# Patient Record
Sex: Female | Born: 1995 | Race: Black or African American | Hispanic: No | Marital: Single | Smoking: Never smoker
Health system: Southern US, Community
[De-identification: ages and names within clinical notes are randomized; demographics above are authoritative.]

---
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
## 2017-08-18 ENCOUNTER — Other Ambulatory Visit: Payer: Self-pay

## 2017-08-18 ENCOUNTER — Encounter (HOSPITAL_COMMUNITY): Payer: Self-pay | Admitting: Emergency Medicine

## 2017-08-18 ENCOUNTER — Emergency Department (HOSPITAL_COMMUNITY)
Admission: EM | Admit: 2017-08-18 | Discharge: 2017-08-18 | Disposition: A | Discharge status: Home / Self Care | Payer: Self-pay | Attending: Emergency Medicine | Admitting: Emergency Medicine

## 2017-08-18 DIAGNOSIS — N938 Other specified abnormal uterine and vaginal bleeding: Secondary | ICD-10-CM | POA: Insufficient documentation

## 2017-08-18 DIAGNOSIS — Z79899 Other long term (current) drug therapy: Secondary | ICD-10-CM | POA: Insufficient documentation

## 2017-08-18 LAB — I-STAT BETA HCG BLOOD, ED (MC, WL, AP ONLY)

## 2017-08-18 NOTE — ED Provider Notes (Signed)
[facility] EMERGENCY DEPARTMENT Provider Note   CSN: [id number] Arrival date & time: 08/18/17  1607   History   Chief Complaint Chief Complaint  Patient presents with  . Vaginal Bleeding    HPI Cassandra Velazquez is a 22 y.o. female.  HPI   22 year old female presents today with complaints of vaginal bleeding.  Patient notes a long-term history of irregular menstrual cycles.  She notes over the last several months she has had intermittent spotting.  She notes she started having bleeding yesterday typical of her menstrual cycles, she notes this was increased in volume noting she is used 7 pads over the last 24 hours.  Patient denies any dizziness, lightheadedness, chest pain or shortness of breath.  She denies any bleeding disorders.  She notes she has been under significant stress recently.  Patient reports she is not sexually active and has not been for over a year.  She denies any preceding vaginal discharge.  Very minimal pelvic cramping no significant abdominal pain.   History reviewed. No pertinent past medical history.  Patient Active Problem List   Diagnosis Date Noted  . Migraine without aura and without status migrainosus, not intractable 09/11/2014  . Episodic tension type headache 09/11/2014  . Poor sleep hygiene 09/11/2014  . Family history of migraine 09/11/2014    History reviewed. No pertinent surgical history.   OB History   None      Home Medications    Prior to Admission medications   Medication Sig Start Date End Date Taking? Authorizing Provider  loratadine (CLARITIN) 10 MG tablet Take 10 mg by mouth daily as needed for allergies.    [provider]    Family History History reviewed. No pertinent family history.  Social History Social History   Tobacco Use  . Smoking status: Never Smoker  . Smokeless tobacco: Never Used  Substance Use Topics  . Alcohol use: Not Currently    Alcohol/week: 0.0 oz  . Drug use:  Never     Allergies   Patient has no known allergies.   Review of Systems Review of Systems  All other systems reviewed and are negative.    Physical Exam Updated Vital Signs BP 109/67   Pulse 79   Temp 98.4 F (36.9 C) (Oral)   Resp 16   Ht 5\' 5"  (1.651 m)   Wt 64.4 kg (142 lb)   LMP 08/18/2017   SpO2 100%   BMI 23.63 kg/m   Physical Exam  Constitutional: She is oriented to person, place, and time. She appears well-developed and well-nourished.  HENT:  Head: Normocephalic and atraumatic.  Eyes: Pupils are equal, round, and reactive to light. Conjunctivae are normal. Right eye exhibits no discharge. Left eye exhibits no discharge. No scleral icterus.  Neck: Normal range of motion. No JVD present. No tracheal deviation present.  Pulmonary/Chest: Effort normal. No stridor.  Abdominal: Soft. She exhibits no distension and no mass. There is no tenderness. There is no rebound and no guarding. No hernia.  Neurological: She is alert and oriented to person, place, and time. Coordination normal.  Psychiatric: She has a normal mood and affect. Her behavior is normal. Judgment and thought content normal.  Nursing note and vitals reviewed.    ED Treatments / Results  Labs (all labs ordered are listed, but only abnormal results are displayed) Labs Reviewed  I-STAT BETA HCG BLOOD, ED ([facility], [facility], [facility] ONLY)    EKG None  Radiology No results found.  Procedures  Procedures (including critical care time)  Medications Ordered in ED Medications - No data to display   Initial Impression / Assessment and Plan / ED Course  I have reviewed the triage vital signs and the nursing notes.  Pertinent labs & imaging results that were available during my care of the patient were reviewed by me and considered in my medical decision making (see chart for details).      Final Clinical Impressions(s) / ED Diagnoses   Final diagnoses:  Dysfunctional uterine bleeding    Labs: I- stat  beta hcg  Imaging:  Consults:  Therapeutics:  Discharge Meds:   Assessment/Plan: 22 year old female presents today with vaginal bleeding.  This is day 2, likely her menstrual cycle.  She has no significant pain, no hemodynamic changes.  Likely dysfunctional uterine bleeding.  Patient's mother is at bedside, patient will be discharged with outpatient OB/GYN follow-up, if she continues to have heavy bleeding, increased bleeding, or any concerning signs or symptoms she will return.  Both mother and daughter verbalized understanding and agreement to today's plan had no further questions or concerns.    ED Discharge Orders    None       Rosalio LoudHedges, [place], PA-C 08/18/17 2114    Tilden Fossaees, Elizabeth, MD 08/19/17 1331

## 2017-08-18 NOTE — Discharge Instructions (Signed)
Please read attached information. If you experience any new or worsening signs or symptoms please return to the emergency room for evaluation. Please follow-up with your primary care provider or specialist as discussed. Please use medication prescribed only as directed and discontinue taking if you have any concerning signs or symptoms.   °

## 2017-08-18 NOTE — ED Triage Notes (Signed)
Pt states she has not had a period since January, but has had random spotting. This morning, pt woke up in blood with lots of clots. Pt has used 4 large tampons today. Pt also reports abd cramping.

## 2017-12-20 ENCOUNTER — Encounter (HOSPITAL_COMMUNITY): Payer: Self-pay | Admitting: Emergency Medicine

## 2017-12-20 ENCOUNTER — Emergency Department (HOSPITAL_COMMUNITY)
Admission: EM | Admit: 2017-12-20 | Discharge: 2017-12-20 | Disposition: A | Discharge status: Home / Self Care | Payer: No Typology Code available for payment source | Attending: Emergency Medicine | Admitting: Emergency Medicine

## 2017-12-20 DIAGNOSIS — Y92410 Unspecified street and highway as the place of occurrence of the external cause: Secondary | ICD-10-CM | POA: Diagnosis not present

## 2017-12-20 DIAGNOSIS — G44319 Acute post-traumatic headache, not intractable: Secondary | ICD-10-CM | POA: Diagnosis not present

## 2017-12-20 DIAGNOSIS — Y999 Unspecified external cause status: Secondary | ICD-10-CM | POA: Diagnosis not present

## 2017-12-20 DIAGNOSIS — M25562 Pain in left knee: Secondary | ICD-10-CM | POA: Diagnosis present

## 2017-12-20 DIAGNOSIS — Y939 Activity, unspecified: Secondary | ICD-10-CM | POA: Insufficient documentation

## 2017-12-20 MED ORDER — NAPROXEN 500 MG PO TABS: 500.0000 mg | ORAL_TABLET | Freq: Two times a day (BID) | ORAL | 0 refills | Status: DC

## 2017-12-20 MED ORDER — METHOCARBAMOL 500 MG PO TABS: 500.0000 mg | ORAL_TABLET | Freq: Every evening | ORAL | 0 refills | Status: DC | PRN

## 2017-12-20 MED ORDER — ACETAMINOPHEN 325 MG PO TABS
650.0000 mg | ORAL_TABLET | Freq: Once | ORAL | Status: AC
Start: 2017-12-20 — End: 2017-12-20
  Administered 2017-12-20: 650 mg via ORAL
  Filled 2017-12-20: qty 2
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
## 2020-03-08 ENCOUNTER — Encounter (HOSPITAL_COMMUNITY): Payer: Self-pay | Admitting: Emergency Medicine

## 2020-03-08 ENCOUNTER — Emergency Department (HOSPITAL_COMMUNITY)
Admission: EM | Admit: 2020-03-08 | Discharge: 2020-03-08 | Disposition: A | Discharge status: Home / Self Care | Payer: HRSA Program | Attending: Emergency Medicine | Admitting: Emergency Medicine

## 2020-03-08 ENCOUNTER — Other Ambulatory Visit: Payer: Self-pay

## 2020-03-08 DIAGNOSIS — R059 Cough, unspecified: Secondary | ICD-10-CM | POA: Diagnosis present

## 2020-03-08 DIAGNOSIS — R030 Elevated blood-pressure reading, without diagnosis of hypertension: Secondary | ICD-10-CM | POA: Insufficient documentation

## 2020-03-08 DIAGNOSIS — U071 COVID-19: Secondary | ICD-10-CM | POA: Diagnosis not present

## 2020-03-08 LAB — RESP PANEL BY RT-PCR (FLU A&B, COVID) ARPGX2
Influenza A by PCR: NEGATIVE
Influenza B by PCR: NEGATIVE
SARS Coronavirus 2 by RT PCR: POSITIVE — AB

## 2020-03-08 MED ORDER — ACETAMINOPHEN 500 MG PO TABS
1000.0000 mg | ORAL_TABLET | Freq: Once | ORAL | Status: AC
Start: 2020-03-08 — End: 2020-03-08
  Administered 2020-03-08: 1000 mg via ORAL
  Filled 2020-03-08: qty 2

## 2020-03-08 NOTE — ED Triage Notes (Signed)
Pt arrives to ED with c/o of COVID exposure and need for COVID test. Pt c/o runny nose, fever, headache, and body aches.

## 2020-03-08 NOTE — Discharge Instructions (Signed)
It was our pleasure to provide your ER care today - we hope that you feel better.  Your covid test is positive - see covid information.  Stay well hydrated and get adequate nutrition. Stay active, take full and deep breaths.   Take acetaminophen or ibuprofen as need.   Return to ER if worse, new symptoms, increased trouble breathing, or other concern.

## 2020-03-08 NOTE — ED Provider Notes (Signed)
[facility] EMERGENCY DEPARTMENT Provider Note   CSN: [id number] Arrival date & time: 03/08/20  1836     History Chief Complaint  Patient presents with  . Covid Exposure    Cassandra Velazquez is a 25 y.o. female.  Patient c/o congestion, body aches, rhinorrhea, occasionally non prod cough, in the past 1-2 days. Symptoms acute onset, mild-moderate, persistent, constant. No specific known covid exposure. Has had covid vaccine x 2, plus booster dose. Denies chest pain or discomfort. No sob. No abd pain or nvd. No dysuria or gu c/o. No rash. No severe headaches.  No neck pain or stiffness.   The history is provided by the patient.       History reviewed. No pertinent past medical history.  Patient Active Problem List   Diagnosis Date Noted  . Migraine without aura and without status migrainosus, not intractable 09/11/2014  . Episodic tension type headache 09/11/2014  . Poor sleep hygiene 09/11/2014  . Family history of migraine 09/11/2014    History reviewed. No pertinent surgical history.   OB History   No obstetric history on file.     History reviewed. No pertinent family history.  Social History   Tobacco Use  . Smoking status: Never Smoker  . Smokeless tobacco: Never Used  Substance Use Topics  . Alcohol use: Not Currently    Alcohol/week: 0.0 standard drinks  . Drug use: Never    Home Medications Prior to Admission medications   Medication Sig Start Date End Date Taking? Authorizing Provider  loratadine (CLARITIN) 10 MG tablet Take 10 mg by mouth daily as needed for allergies.    [provider]  methocarbamol (ROBAXIN) 500 MG tablet Take 1 tablet (500 mg total) by mouth at bedtime as needed for muscle spasms. 12/20/17   Maczis, Elmer Sow, PA-C  naproxen (NAPROSYN) 500 MG tablet Take 1 tablet (500 mg total) by mouth 2 (two) times daily. 12/20/17   Maczis, Elmer Sow, PA-C    Allergies    Patient has no known allergies.  Review  of Systems   Review of Systems  Constitutional: Negative for fever.  HENT: Positive for congestion and rhinorrhea. Negative for sore throat.   Eyes: Negative for redness.  Respiratory: Positive for cough. Negative for shortness of breath.   Cardiovascular: Negative for chest pain.  Gastrointestinal: Negative for abdominal pain and vomiting.  Genitourinary: Negative for flank pain.  Musculoskeletal: Positive for myalgias. Negative for back pain and neck pain.  Skin: Negative for rash.  Neurological: Negative for headaches.  Hematological: Does not bruise/bleed easily.  Psychiatric/Behavioral: Negative for confusion.    Physical Exam Updated Vital Signs BP (!) 128/91 (BP Location: Right Arm)   Pulse (!) 109   Temp 99.2 F (37.3 C) (Oral)   Resp 14   SpO2 97%   Physical Exam Vitals and nursing note reviewed.  Constitutional:      Appearance: Normal appearance. She is well-developed.  HENT:     Head: Atraumatic.     Nose: Nose normal.     Mouth/Throat:     Mouth: Mucous membranes are moist.     Pharynx: Oropharynx is clear. No oropharyngeal exudate or posterior oropharyngeal erythema.  Eyes:     General: No scleral icterus.    Conjunctiva/sclera: Conjunctivae normal.  Neck:     Trachea: No tracheal deviation.     Comments: No stiffness or rigidity.  Cardiovascular:     Rate and Rhythm: Normal rate and regular rhythm.  Pulses: Normal pulses.     Heart sounds: Normal heart sounds. No murmur heard. No friction rub. No gallop.   Pulmonary:     Effort: Pulmonary effort is normal. No respiratory distress.     Breath sounds: Normal breath sounds.  Abdominal:     General: Bowel sounds are normal. There is no distension.     Palpations: Abdomen is soft.     Tenderness: There is no abdominal tenderness.  Genitourinary:    Comments: No cva tenderness.  Musculoskeletal:        General: No swelling or tenderness.     Cervical back: Normal range of motion and neck supple. No  rigidity. No muscular tenderness.     Right lower leg: No edema.     Left lower leg: No edema.  Lymphadenopathy:     Cervical: No cervical adenopathy.  Skin:    General: Skin is warm and dry.     Findings: No rash.  Neurological:     Mental Status: She is alert.     Comments: Alert, speech normal. Steady gait.   Psychiatric:        Mood and Affect: Mood normal.     ED Results / Procedures / Treatments   Labs (all labs ordered are listed, but only abnormal results are displayed) Labs Reviewed  RESP PANEL BY RT-PCR (FLU A&B, COVID) ARPGX2 - Abnormal; Notable for the following components:      Result Value   SARS Coronavirus 2 by RT PCR POSITIVE (*)    All other components within normal limits    EKG None  Radiology No results found.  Procedures Procedures (including critical care time)  Medications Ordered in ED Medications - No data to display  ED Course  I have reviewed the triage vital signs and the nursing notes.  Pertinent labs & imaging results that were available during my care of the patient were reviewed by me and considered in my medical decision making (see chart for details).    MDM Rules/Calculators/A&P                          Covid swab sent.   Reviewed nursing notes and prior charts for additional history.   Labs reviewed/interpreted by me - covid test is positive.  Discussed w pt.   Cassandra Velazquez was evaluated in Emergency Department on 03/08/2020 for the symptoms described in the history of present illness. She was evaluated in the context of the global COVID-19 pandemic, which necessitated consideration that the patient might be at risk for infection with the SARS-CoV-2 virus that causes COVID-19. Institutional protocols and algorithms that pertain to the evaluation of patients at risk for COVID-19 are in a state of rapid change based on information released by regulatory bodies including the CDC and federal and state organizations. These  policies and algorithms were followed during the patient's care in the ED.  Acetaminophen po. Po fluids.  Pt is breathing comfortably, and currently appears stable for d/c.   Return precautions provided.    Final Clinical Impression(s) / ED Diagnoses Final diagnoses:  None    Rx / DC Orders ED Discharge Orders    None       Cathren Laine, MD 03/08/20 2139

## 2022-01-30 ENCOUNTER — Other Ambulatory Visit: Payer: Self-pay

## 2022-01-30 ENCOUNTER — Emergency Department (HOSPITAL_COMMUNITY)
Admission: EM | Admit: 2022-01-30 | Discharge: 2022-01-31 | Disposition: A | Discharge status: Home / Self Care | Payer: Commercial Managed Care - HMO | Attending: Emergency Medicine | Admitting: Emergency Medicine

## 2022-01-30 ENCOUNTER — Encounter (HOSPITAL_COMMUNITY): Payer: Self-pay

## 2022-01-30 DIAGNOSIS — K644 Residual hemorrhoidal skin tags: Secondary | ICD-10-CM | POA: Diagnosis not present

## 2022-01-30 DIAGNOSIS — R1033 Periumbilical pain: Secondary | ICD-10-CM | POA: Insufficient documentation

## 2022-01-30 LAB — COMPREHENSIVE METABOLIC PANEL
ALT: 21 U/L (ref 0–44)
AST: 31 U/L (ref 15–41)
Albumin: 3.6 g/dL (ref 3.5–5.0)
Alkaline Phosphatase: 58 U/L (ref 38–126)
Anion gap: 10 (ref 5–15)
BUN: 6 mg/dL (ref 6–20)
CO2: 24 mmol/L (ref 22–32)
Calcium: 9 mg/dL (ref 8.9–10.3)
Chloride: 103 mmol/L (ref 98–111)
Creatinine, Ser: 0.56 mg/dL (ref 0.44–1.00)
GFR, Estimated: >60 mL/min (ref >60)
Glucose, Bld: 92 mg/dL (ref 70–99)
Potassium: 3.3 mmol/L — ABNORMAL LOW (ref 3.5–5.1)
Sodium: 137 mmol/L (ref 135–145)
Total Bilirubin: 0.6 mg/dL (ref 0.3–1.2)
Total Protein: 7.1 g/dL (ref 6.5–8.1)

## 2022-01-30 LAB — CBC WITH DIFFERENTIAL/PLATELET
Abs Immature Granulocytes: 0.02 10*3/uL (ref 0.00–0.07)
Basophils Absolute: 0 10*3/uL (ref 0.0–0.1)
Basophils Relative: 0 %
Eosinophils Absolute: 0.1 10*3/uL (ref 0.0–0.5)
Eosinophils Relative: 1 %
HCT: 38.7 % (ref 36.0–46.0)
Hemoglobin: 12.9 g/dL (ref 12.0–15.0)
Immature Granulocytes: 0 %
Lymphocytes Relative: 34 %
Lymphs Abs: 1.9 10*3/uL (ref 0.7–4.0)
MCH: 27 pg (ref 26.0–34.0)
MCHC: 33.3 g/dL (ref 30.0–36.0)
MCV: 81 fL (ref 80.0–100.0)
Monocytes Absolute: 0.4 10*3/uL (ref 0.1–1.0)
Monocytes Relative: 7 %
Neutro Abs: 3.3 10*3/uL (ref 1.7–7.7)
Neutrophils Relative %: 58 %
Platelets: 353 10*3/uL (ref 150–400)
RBC: 4.78 MIL/uL (ref 3.87–5.11)
RDW: 12.8 % (ref 11.5–15.5)
WBC: 5.6 10*3/uL (ref 4.0–10.5)
nRBC: 0 % (ref 0.0–0.2)

## 2022-01-30 LAB — URINALYSIS, ROUTINE W REFLEX MICROSCOPIC
Bilirubin Urine: NEGATIVE
Glucose, UA: NEGATIVE mg/dL
Hgb urine dipstick: NEGATIVE
Ketones, ur: NEGATIVE mg/dL
Leukocytes,Ua: NEGATIVE
Nitrite: NEGATIVE
Protein, ur: NEGATIVE mg/dL
Specific Gravity, Urine: 1.014 (ref 1.005–1.030)
pH: 6 (ref 5.0–8.0)

## 2022-01-30 LAB — TYPE AND SCREEN
ABO/RH(D): O POS
Antibody Screen: NEGATIVE

## 2022-01-30 LAB — LIPASE, BLOOD: Lipase: 28 U/L (ref 11–51)

## 2022-01-30 LAB — PREGNANCY, URINE: Preg Test, Ur: NEGATIVE

## 2022-01-30 MED ORDER — DIPHENHYDRAMINE HCL 25 MG PO CAPS
25.0000 mg | ORAL_CAPSULE | Freq: Once | ORAL | Status: AC
  Administered 2022-01-31: 25 mg via ORAL
  Filled 2022-01-30: qty 1

## 2022-01-30 NOTE — ED Triage Notes (Signed)
Pt reports she noticed her stool was black today, did not see any bright red blood. She also reports generalized abdominal cramping and nausea. She also has noticed a rash on her both of her arms and legs.

## 2022-01-30 NOTE — ED Provider Triage Note (Signed)
Emergency Medicine Provider Triage Evaluation Note  Cassandra Velazquez , a 26 y.o. female  was evaluated in triage.  Pt complains of generalized abdominal pain and black stool.  She also has a rash on her arms.  She did use Pepto-Bismol yesterday..  Review of Systems  Positive: Dark stools Negative: Vomiting blood  Physical Exam  BP 128/84 (BP Location: Right Arm)   Pulse 91   Temp 98.7 F (37.1 C) (Oral)   Resp 18   Ht 5\' 5"  (1.651 m)   Wt 86.2 kg   LMP 01/16/2022 (Approximate)   SpO2 99%   BMI 31.62 kg/m  Gen:   Awake, no distress   Resp:  Normal effort  MSK:   Moves extremities without difficulty  Other:  Abdomen soft without masses  Medical Decision Making  Medically screening exam initiated at 5:09 PM.  Appropriate orders placed.  Levada Bowersox Maresh was informed that the remainder of the evaluation will be completed by another provider, this initial triage assessment does not replace that evaluation, and the importance of remaining in the ED until their evaluation is complete.     Kenard Gower, MD 01/31/22 [phone]

## 2022-01-30 NOTE — Discharge Instructions (Addendum)
You are seen here today for your abdominal pain.  Your physical exam was reassuring as was your blood work.  You may continue to use over-the-counter medication such as Pepto-Bismol for symptoms.  Please continue to monitor symptoms for worsening abdominal pain, fevers, chills, nausea or vomiting does not stop, or any other severe symptom return to the ER develop these or any other severe symptom. Regarding your rash you have hives, you may take over-the-counter Benadryl and follow-up in the outpatient setting.  Return to the ER for develop any lip swelling, numbness or tingling difficulty swallowing, difficulty breathing, or any other new severe symptoms.

## 2022-01-30 NOTE — ED Provider Notes (Addendum)
[facility] EMERGENCY DEPARTMENT Provider Note   CSN: [id number] Arrival date & time: 01/30/22  1502     History  Chief Complaint  Patient presents with   Rectal Bleeding    Cassandra Velazquez is a 26 y.o. female who states that she had some periumbilical abdominal pain intermittent, mild yesterday and aching in nature prompting her to take some Pepto-Bismol.  Today had single episode of dark black stool although she denies tarry texture or history of melena in the past.  No hematochezia.  No nausea vomiting fevers chills or diarrhea.  No history of abdominal surgeries.  LMP 2 weeks ago.  No urinary symptoms at this time. The above listed history history patient has history of tension type headaches and migraines.  No medications daily aside from OCPs.  In addition patient endorses that she has had some hives on her arms following transition to new laundry detergent.  No medications Taken at home orally but did apply topical cortisone which she states was helpful.  No oropharyngeal symptoms or difficulty breathing. No hx of allergic rxn.  HPI     Home Medications Prior to Admission medications   Medication Sig Start Date End Date Taking? Authorizing Provider  ESTARYLLA 0.25-35 MG-MCG tablet Take 1 tablet by mouth daily.   Yes [provider]  methocarbamol (ROBAXIN) 500 MG tablet Take 1 tablet (500 mg total) by mouth at bedtime as needed for muscle spasms. Patient not taking: Reported on 01/30/2022 12/20/17   Maczis, Elmer Sow, PA-C  naproxen (NAPROSYN) 500 MG tablet Take 1 tablet (500 mg total) by mouth 2 (two) times daily. Patient not taking: Reported on 01/30/2022 12/20/17   Jacinto Halim, PA-C      Allergies    Patient has no known allergies.    Review of Systems   Review of Systems  Gastrointestinal:  Positive for abdominal pain.       Black stool  Skin:  Positive for rash.    Physical Exam Updated Vital Signs BP (!) 129/93 (BP Location:  Right Arm)   Pulse 90   Temp 98 F (36.7 C) (Oral)   Resp 17   Ht 5\' 5"  (1.651 m)   Wt 86.2 kg   LMP 01/16/2022 (Approximate)   SpO2 100%   BMI 31.62 kg/m  Physical Exam Vitals and nursing note reviewed. Exam conducted with a chaperone present (ED 01/18/2022).  Constitutional:      Appearance: She is obese. She is not ill-appearing or toxic-appearing.  HENT:     Head: Normocephalic and atraumatic.     Mouth/Throat:     Mouth: Mucous membranes are moist.     Pharynx: No oropharyngeal exudate or posterior oropharyngeal erythema.  Eyes:     General:        Right eye: No discharge.        Left eye: No discharge.     Extraocular Movements: Extraocular movements intact.     Conjunctiva/sclera: Conjunctivae normal.     Pupils: Pupils are equal, round, and reactive to light.  Cardiovascular:     Rate and Rhythm: Normal rate and regular rhythm.     Pulses: Normal pulses.     Heart sounds: Normal heart sounds. No murmur heard. Pulmonary:     Effort: Pulmonary effort is normal. No respiratory distress.     Breath sounds: Normal breath sounds. No wheezing or rales.  Abdominal:     General: Bowel sounds are normal. There is no distension.  Palpations: Abdomen is soft.     Tenderness: There is abdominal tenderness in the periumbilical area. There is no guarding or rebound. Negative signs include Rovsing's sign, McBurney's sign, psoas sign and obturator sign.     Comments: Mild periumbilical tenderness palpation without rebound or guarding.  No CVAT.  Genitourinary:    Rectum: Guaiac result negative. External hemorrhoid present. No tenderness or internal hemorrhoid.     Comments: Non thrombosed external hemorrhoids, dark green to black stool present on the gloved finger of this provider folloowing exam Musculoskeletal:        General: No deformity.     Cervical back: Neck supple.     Right lower leg: No edema.     Left lower leg: No edema.  Skin:    General: Skin is warm and  dry.     Capillary Refill: Capillary refill takes less than 2 seconds.  Neurological:     General: No focal deficit present.     Mental Status: She is alert and oriented to person, place, and time. Mental status is at baseline.  Psychiatric:        Mood and Affect: Mood normal.     ED Results / Procedures / Treatments   Labs (all labs ordered are listed, but only abnormal results are displayed) Labs Reviewed  URINALYSIS, ROUTINE W REFLEX MICROSCOPIC - Abnormal; Notable for the following components:      Result Value   APPearance HAZY (*)    All other components within normal limits  COMPREHENSIVE METABOLIC PANEL - Abnormal; Notable for the following components:   Potassium 3.3 (*)    All other components within normal limits  CBC WITH DIFFERENTIAL/PLATELET  PREGNANCY, URINE  LIPASE, BLOOD  POC OCCULT BLOOD, ED  TYPE AND SCREEN    EKG None  Radiology No results found.  Procedures Procedures    Medications Ordered in ED Medications  diphenhydrAMINE (BENADRYL) capsule 25 mg (25 mg Oral Given 01/31/22 0000)    ED Course/ Medical Decision Making/ A&P                           Medical Decision Making 26 year old female who presents with concern for black stool today.  No history of same.  Vital signs normal on intake.  Cardiopulmonary sounds normal, abdominal exam with mild periumbilical tenderness palpation otherwise unremarkable.  Rectal exam as above, green to black stool in the fingers provider following exam.  Mild urticaria on the left upper arm.  No oropharyngeal swelling, sublingual or submental tenderness palpation, hoarseness of voice, or wheezing on exam.  Amount and/or Complexity of Data Reviewed Labs:     Details: CBC without leukocytosis or anemia, CMP with mild hypokalemia 3.3, otherwise unremarkable.  Hemoccult is negative, lipase is normal, patient is not pregnant and urine is without evidence of infection.   Given patient's usage of Pepto-Bismol  yesterday suspect discoloration of her stool secondary to this medication.  While the exact etiology of her periumbilical pain remains unclear there is no appear to be any emergent etiology at this time.  Did discuss role of CT imaging, however after discussion patient is comfortable foregoing imaging for watchful waiting and monitoring her symptoms at home.  Feel this is the most reasonable plan and judicious use of radiation for this patient.  Patient is well-appearing at this time, tolerating p.o., Benadryl offered for urticaria which is likely related to recent change in laundry detergent.  No evidence  of oropharyngeal involvement and her allergic reaction.  Clinical concern for emergent underlying etiology for symptoms that would warrant further ED workup or inpatient management is exceedingly low.  Lauralynn voiced understanding of her medical evaluation and treatment plan. Each of their questions answered to their expressed satisfaction.  Return precautions were given.  Patient is well-appearing, stable, and was discharged in good condition.  This chart was dictated using voice recognition software, Dragon. Despite the best efforts of this provider to proofread and correct errors, errors may still occur which can change documentation meaning.   Final Clinical Impression(s) / ED Diagnoses Final diagnoses:  Periumbilical abdominal pain    Rx / DC Orders ED Discharge Orders     None          Paris Lore, PA-C 01/31/22 0220    Gilda Crease, MD 01/31/22 [phone]

## 2022-01-31 LAB — POC OCCULT BLOOD, ED: Fecal Occult Bld: NEGATIVE

## 2022-01-31 NOTE — ED Notes (Signed)
Pt verbalized understanding of d/c instructions, meds, and followup care. Denies questions. VSS, no distress noted. Steady gait to exit with all belongings.  ?

## 2022-04-23 ENCOUNTER — Ambulatory Visit: Payer: Self-pay

## 2022-06-19 ENCOUNTER — Encounter (HOSPITAL_BASED_OUTPATIENT_CLINIC_OR_DEPARTMENT_OTHER): Payer: Self-pay | Admitting: Emergency Medicine

## 2022-06-19 ENCOUNTER — Emergency Department (HOSPITAL_BASED_OUTPATIENT_CLINIC_OR_DEPARTMENT_OTHER)
Admission: EM | Admit: 2022-06-19 | Discharge: 2022-06-19 | Disposition: A | Discharge status: Home / Self Care | Payer: BLUE CROSS/BLUE SHIELD | Attending: Emergency Medicine | Admitting: Emergency Medicine

## 2022-06-19 ENCOUNTER — Other Ambulatory Visit: Payer: Self-pay

## 2022-06-19 ENCOUNTER — Emergency Department (HOSPITAL_BASED_OUTPATIENT_CLINIC_OR_DEPARTMENT_OTHER): Payer: BLUE CROSS/BLUE SHIELD

## 2022-06-19 ENCOUNTER — Emergency Department (HOSPITAL_BASED_OUTPATIENT_CLINIC_OR_DEPARTMENT_OTHER): Payer: BLUE CROSS/BLUE SHIELD | Admitting: Radiology

## 2022-06-19 DIAGNOSIS — Y92410 Unspecified street and highway as the place of occurrence of the external cause: Secondary | ICD-10-CM | POA: Insufficient documentation

## 2022-06-19 DIAGNOSIS — S5011XA Contusion of right forearm, initial encounter: Secondary | ICD-10-CM | POA: Insufficient documentation

## 2022-06-19 DIAGNOSIS — S161XXA Strain of muscle, fascia and tendon at neck level, initial encounter: Secondary | ICD-10-CM | POA: Diagnosis not present

## 2022-06-19 DIAGNOSIS — M79631 Pain in right forearm: Secondary | ICD-10-CM | POA: Diagnosis present

## 2022-06-19 LAB — PREGNANCY, URINE: Preg Test, Ur: NEGATIVE

## 2022-06-19 MED ORDER — IBUPROFEN 600 MG PO TABS: 600.0000 mg | ORAL_TABLET | Freq: Four times a day (QID) | ORAL | 0 refills | Status: DC | PRN

## 2022-06-19 MED ORDER — HYDROCODONE-ACETAMINOPHEN 5-325 MG PO TABS: 1.0000 | ORAL_TABLET | ORAL | 0 refills | Status: AC | PRN

## 2022-06-19 MED ORDER — MORPHINE SULFATE (PF) 4 MG/ML IV SOLN
4.0000 mg | Freq: Once | INTRAVENOUS | Status: AC
  Administered 2022-06-19: 4 mg via INTRAMUSCULAR
  Filled 2022-06-19: qty 1
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
## 2022-06-19 NOTE — ED Notes (Signed)
Patient educated about not driving or performing other critical tasks (such as operating heavy machinery, caring for infant/toddler/child) due to sedative nature of narcotic medications received while in the ED.  Pt/caregiver verbalized understanding. (Pt states her aunt is driving)

## 2022-06-19 NOTE — ED Notes (Signed)
C-collar applied as precaution

## 2022-06-19 NOTE — ED Notes (Signed)
Pt d/c via w/c to POV
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
## 2022-07-10 ENCOUNTER — Other Ambulatory Visit: Payer: Self-pay

## 2022-07-10 ENCOUNTER — Encounter (HOSPITAL_BASED_OUTPATIENT_CLINIC_OR_DEPARTMENT_OTHER): Payer: Self-pay

## 2022-07-10 ENCOUNTER — Emergency Department (HOSPITAL_BASED_OUTPATIENT_CLINIC_OR_DEPARTMENT_OTHER)
Admission: EM | Admit: 2022-07-10 | Discharge: 2022-07-10 | Disposition: A | Discharge status: Home / Self Care | Payer: BLUE CROSS/BLUE SHIELD | Attending: Emergency Medicine | Admitting: Emergency Medicine

## 2022-07-10 ENCOUNTER — Emergency Department (HOSPITAL_BASED_OUTPATIENT_CLINIC_OR_DEPARTMENT_OTHER): Payer: No Typology Code available for payment source | Admitting: Radiology

## 2022-07-10 DIAGNOSIS — M545 Low back pain, unspecified: Secondary | ICD-10-CM | POA: Diagnosis present

## 2022-07-10 DIAGNOSIS — Y92410 Unspecified street and highway as the place of occurrence of the external cause: Secondary | ICD-10-CM | POA: Diagnosis not present

## 2022-07-10 LAB — PREGNANCY, URINE: Preg Test, Ur: NEGATIVE

## 2022-07-10 MED ORDER — METHOCARBAMOL 500 MG PO TABS: 500.0000 mg | ORAL_TABLET | Freq: Two times a day (BID) | ORAL | 0 refills | Status: AC

## 2022-07-10 MED ORDER — LIDOCAINE 5 % EX PTCH: 1.0000 | MEDICATED_PATCH | CUTANEOUS | 0 refills | Status: AC

## 2022-07-10 MED ORDER — NAPROXEN 500 MG PO TABS: 500.0000 mg | ORAL_TABLET | Freq: Two times a day (BID) | ORAL | 0 refills | Status: AC

## 2022-07-10 NOTE — Discharge Instructions (Signed)
It was a pleasure taking care of you here in the emergency department  We have written you for a few medications to help with your symptoms  Make sure to follow-up with orthopedics or primary care provider if her symptoms do not improve over the next week or so on these medications  Return for new or worsening symptoms
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
